# Patient Record
Sex: Female | Born: 1985 | Race: Black or African American | Hispanic: No | Marital: Married | Smoking: Never smoker
Health system: Southern US, Community
[De-identification: ages and names within clinical notes are randomized; demographics above are authoritative.]

## PROBLEM LIST (undated history)

## (undated) DIAGNOSIS — C801 Malignant (primary) neoplasm, unspecified: Secondary | ICD-10-CM

## (undated) HISTORY — PX: MASTECTOMY: SHX3

---

## 2018-01-13 ENCOUNTER — Other Ambulatory Visit: Payer: Self-pay | Admitting: Oncology

## 2018-01-13 DIAGNOSIS — R9389 Abnormal findings on diagnostic imaging of other specified body structures: Secondary | ICD-10-CM

## 2018-01-26 ENCOUNTER — Ambulatory Visit
Admission: RE | Admit: 2018-01-26 | Discharge: 2018-01-26 | Disposition: A | Discharge status: Home / Self Care | Payer: PRIVATE HEALTH INSURANCE | Source: Ambulatory Visit | Attending: Oncology | Admitting: Oncology

## 2018-01-26 DIAGNOSIS — R9389 Abnormal findings on diagnostic imaging of other specified body structures: Secondary | ICD-10-CM

## 2018-01-26 MED ORDER — GADOBUTROL 1 MMOL/ML IV SOLN
5.0000 mL | Freq: Once | INTRAVENOUS | Status: AC | PRN
  Administered 2018-01-26: 5 mL via INTRAVENOUS

## 2019-01-20 ENCOUNTER — Encounter (HOSPITAL_COMMUNITY): Payer: Self-pay | Admitting: Emergency Medicine

## 2019-01-20 ENCOUNTER — Other Ambulatory Visit: Payer: Self-pay

## 2019-01-20 ENCOUNTER — Ambulatory Visit (HOSPITAL_COMMUNITY)
Admission: EM | Admit: 2019-01-20 | Discharge: 2019-01-20 | Disposition: A | Discharge status: Home / Self Care | Payer: PRIVATE HEALTH INSURANCE | Attending: Emergency Medicine | Admitting: Emergency Medicine

## 2019-01-20 DIAGNOSIS — Z20828 Contact with and (suspected) exposure to other viral communicable diseases: Secondary | ICD-10-CM | POA: Diagnosis present

## 2019-01-20 DIAGNOSIS — Z20822 Contact with and (suspected) exposure to covid-19: Secondary | ICD-10-CM

## 2019-01-20 HISTORY — DX: Malignant (primary) neoplasm, unspecified: C80.1

## 2019-01-20 NOTE — ED Provider Notes (Signed)
[facility]    CSN: [id number] Arrival date & time: 01/20/19  0845      History   Chief Complaint Chief Complaint  Patient presents with  . Labs Only    HPI Sherri Le is a 33 y.o. female.   Sherri Le presents with requests for covid testing. She plans to travel to the [place] in two days and would like to be tested before she leaves. Denies any symptoms or concerns today. No known covid-19 exposures.     ROS per HPI, negative if not otherwise mentioned.      Past Medical History:  Diagnosis Date  . Cancer ([facility])     There are no active problems to display for this patient.   Past Surgical History:  Procedure Laterality Date  . MASTECTOMY      OB History   No obstetric history on file.      Home Medications    Prior to Admission medications   Medication Sig Start Date End Date Taking? Authorizing Provider  ANASTROZOLE PO Take by mouth.   Yes [provider]    Family History History reviewed. No pertinent family history.  Social History Social History   Tobacco Use  . Smoking status: Never Smoker  Substance Use Topics  . Alcohol use: Never    Frequency: Never  . Drug use: Never     Allergies   Patient has no allergy information on record.   Review of Systems Review of Systems   Physical Exam Triage Vital Signs ED Triage Vitals  Enc Vitals Group     BP 01/20/19 0931 108/75     Pulse Rate 01/20/19 0931 78     Resp 01/20/19 0931 18     Temp 01/20/19 0931 (!) 97.3 F (36.3 C)     Temp Source 01/20/19 0931 Oral     SpO2 01/20/19 0931 98 %     Weight --      Height --      Head Circumference --      Peak Flow --      Pain Score 01/20/19 0926 0     Pain Loc --      Pain Edu? --      Excl. in [place]? --    No data found.  Updated Vital Signs BP 108/75 (BP Location: Left Arm)   Pulse 78   Temp (!) 97.3 F (36.3 C) (Oral)   Resp 18   SpO2 98%    Physical Exam Constitutional:      General:  She is not in acute distress.    Appearance: She is well-developed.  Cardiovascular:     Rate and Rhythm: Normal rate.  Pulmonary:     Effort: Pulmonary effort is normal.  Skin:    General: Skin is warm and dry.  Neurological:     Mental Status: She is alert and oriented to person, place, and time.      UC Treatments / Results  Labs (all labs ordered are listed, but only abnormal results are displayed) Labs Reviewed  NOVEL CORONAVIRUS, NAA (HOSP ORDER, SEND-OUT TO REF LAB; TAT 18-24 HRS)    EKG   Radiology No results found.  Procedures Procedures (including critical care time)  Medications Ordered in UC Medications - No data to display  Initial Impression / Assessment and Plan / UC Course  I have reviewed the triage vital signs and the nursing notes.  Pertinent labs & imaging results that were available  during my care of the patient were reviewed by me and considered in my medical decision making (see chart for details).     No acute complaints today. covid-19 screening collected. Safety precautions about covid-19 discussed with patient. Patient verbalized understanding and agreeable to plan.   Final Clinical Impressions(s) / UC Diagnoses   Final diagnoses:  Encounter for laboratory testing for COVID-19 virus     Discharge Instructions     We will notify you by phone of any positive findings. Your negative results will be sent through your MyChart.       ED Prescriptions    None     PDMP not reviewed this encounter.   Zigmund Gottron, NP 01/20/19 [phone]

## 2019-01-20 NOTE — ED Notes (Signed)
No answer

## 2019-01-20 NOTE — Discharge Instructions (Signed)
We will notify you by phone of any positive findings. Your negative results will be sent through your MyChart.

## 2019-01-20 NOTE — ED Triage Notes (Signed)
Patient denies symptoms.  Patient requesting covid testing for travel.  Patient is traveling on Friday.  Have explained covid result may not be back by then.  Patient requests to have test anyway

## 2019-01-22 LAB — NOVEL CORONAVIRUS, NAA (HOSP ORDER, SEND-OUT TO REF LAB; TAT 18-24 HRS): SARS-CoV-2, NAA: NOT DETECTED

## 2019-02-24 ENCOUNTER — Ambulatory Visit: Payer: PRIVATE HEALTH INSURANCE | Attending: Internal Medicine

## 2019-02-24 DIAGNOSIS — Z20822 Contact with and (suspected) exposure to covid-19: Secondary | ICD-10-CM

## 2019-02-25 LAB — NOVEL CORONAVIRUS, NAA: SARS-CoV-2, NAA: NOT DETECTED

## 2019-03-23 ENCOUNTER — Ambulatory Visit: Payer: PRIVATE HEALTH INSURANCE | Attending: Internal Medicine

## 2019-03-23 DIAGNOSIS — Z20822 Contact with and (suspected) exposure to covid-19: Secondary | ICD-10-CM

## 2019-03-24 LAB — NOVEL CORONAVIRUS, NAA: SARS-CoV-2, NAA: NOT DETECTED

## 2019-04-26 ENCOUNTER — Ambulatory Visit: Payer: PRIVATE HEALTH INSURANCE | Attending: Internal Medicine

## 2019-04-26 ENCOUNTER — Ambulatory Visit: Payer: PRIVATE HEALTH INSURANCE

## 2019-04-26 DIAGNOSIS — Z20822 Contact with and (suspected) exposure to covid-19: Secondary | ICD-10-CM

## 2019-04-27 LAB — NOVEL CORONAVIRUS, NAA: SARS-CoV-2, NAA: NOT DETECTED

## 2019-05-01 IMAGING — MG MM CLIP PLACEMENT
2 series · 2 of 2 positions shown · non-contrast
Comparison: Previous exam(s).

CLINICAL DATA: Status post MR guided core biopsy of a small mass in
the UPPER central portion of the LEFT breast.

EXAM:
DIAGNOSTIC LEFT MAMMOGRAM POST MRI BIOPSY

[L ML]
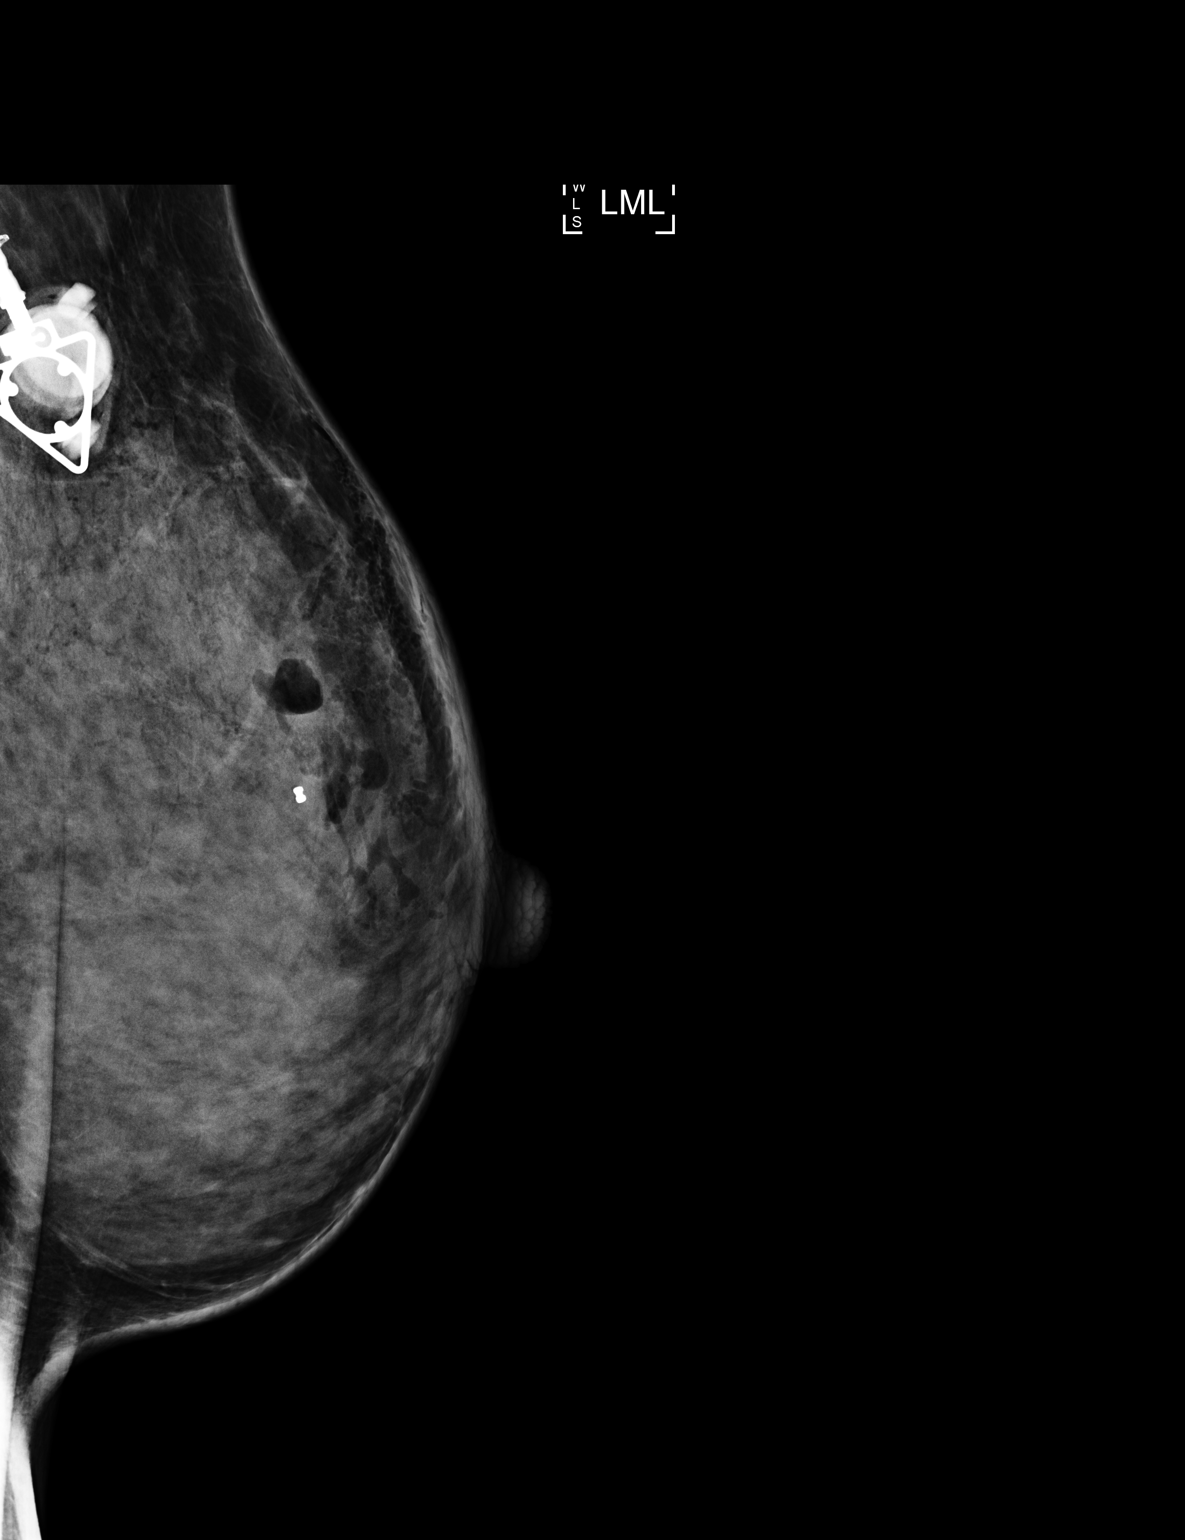

[L CC]
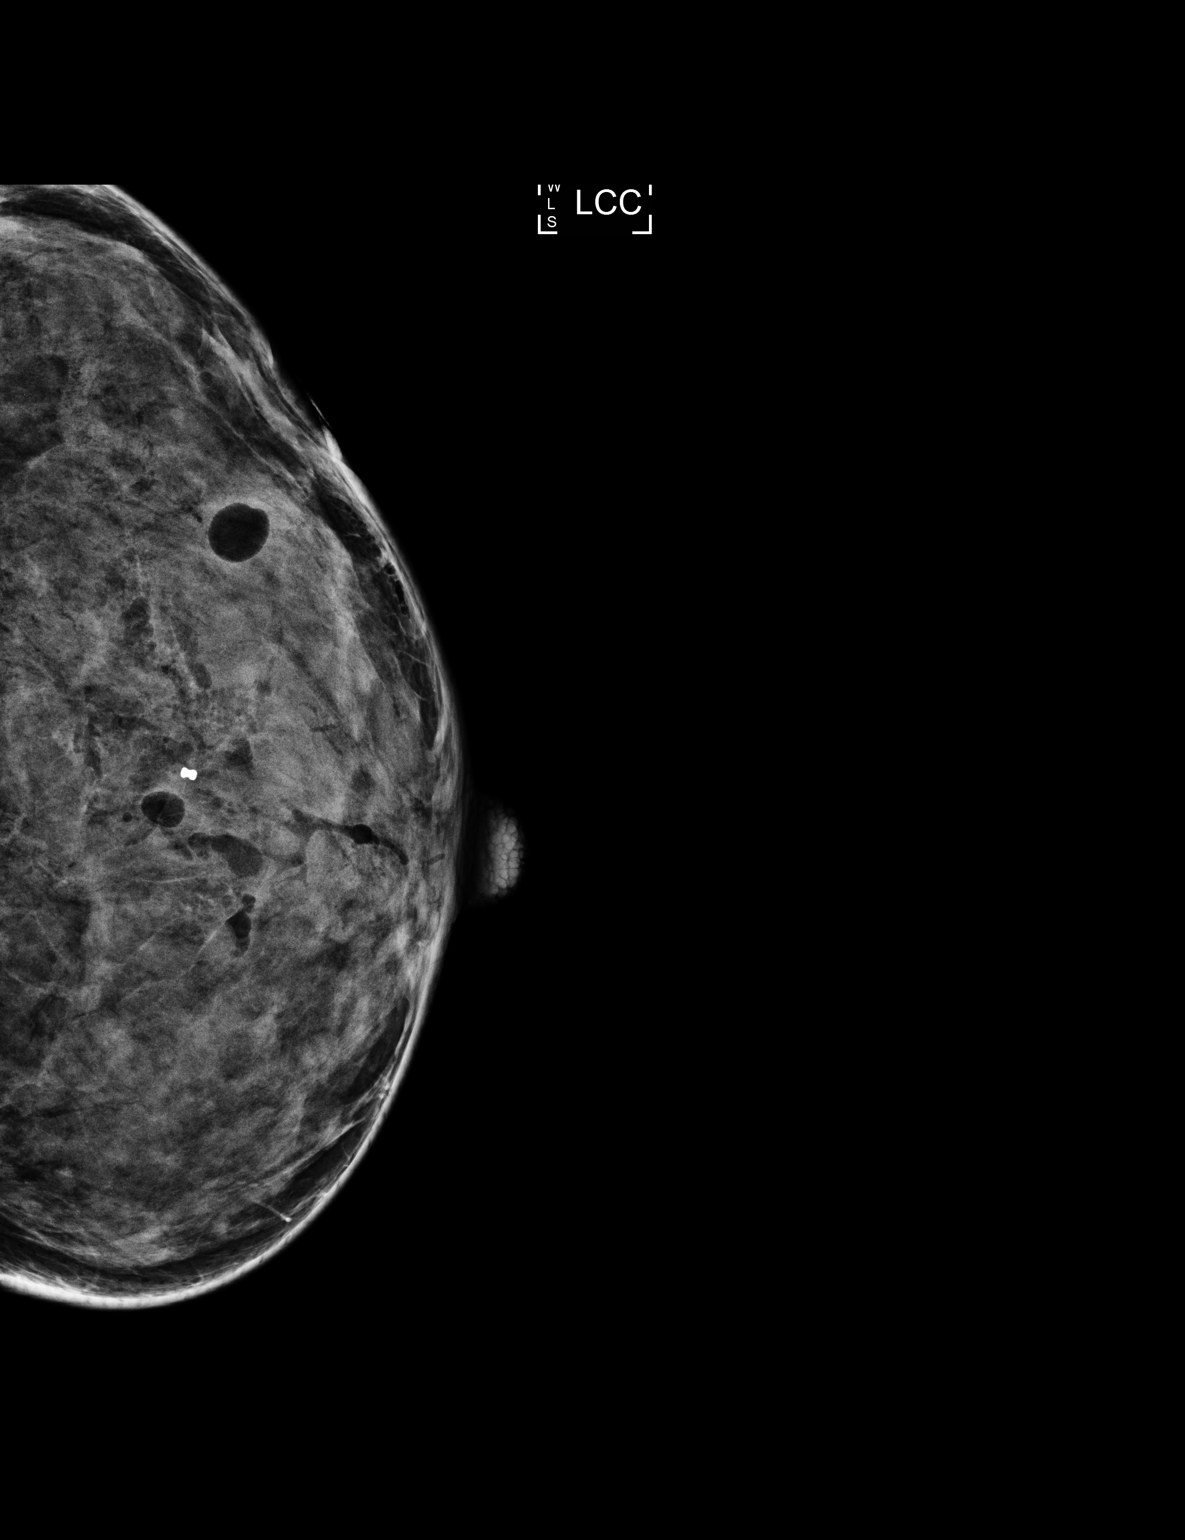

[2 of 2 positions shown; findings below may reference images not displayed]

FINDINGS: Mammographic images were obtained following MRI guided biopsy of
small mass in the central portion of the LEFT breast and placement
of a dumbbell-shaped clip. The clip is identified in the UPPER
central portion of the LEFT breast, as expected following biopsy.
IMPRESSION: Tissue marker clip in the expected location following biopsy.

Final Assessment: Post Procedure Mammograms for Marker Placement

## 2019-05-19 ENCOUNTER — Ambulatory Visit: Payer: PRIVATE HEALTH INSURANCE | Attending: Internal Medicine

## 2019-05-19 DIAGNOSIS — Z20822 Contact with and (suspected) exposure to covid-19: Secondary | ICD-10-CM

## 2019-05-20 LAB — SARS-COV-2, NAA 2 DAY TAT

## 2019-05-20 LAB — NOVEL CORONAVIRUS, NAA: SARS-CoV-2, NAA: NOT DETECTED

## 2019-05-21 ENCOUNTER — Ambulatory Visit: Payer: PRIVATE HEALTH INSURANCE | Attending: Internal Medicine

## 2019-05-21 DIAGNOSIS — Z20822 Contact with and (suspected) exposure to covid-19: Secondary | ICD-10-CM

## 2019-05-22 LAB — NOVEL CORONAVIRUS, NAA: SARS-CoV-2, NAA: NOT DETECTED

## 2019-05-22 LAB — SARS-COV-2, NAA 2 DAY TAT

## 2019-06-23 ENCOUNTER — Ambulatory Visit: Payer: PRIVATE HEALTH INSURANCE | Attending: Internal Medicine

## 2019-06-23 DIAGNOSIS — Z20822 Contact with and (suspected) exposure to covid-19: Secondary | ICD-10-CM

## 2019-06-24 LAB — SARS-COV-2, NAA 2 DAY TAT

## 2019-06-24 LAB — NOVEL CORONAVIRUS, NAA: SARS-CoV-2, NAA: NOT DETECTED

## 2020-01-03 ENCOUNTER — Other Ambulatory Visit: Payer: PRIVATE HEALTH INSURANCE
